# Patient Record
Sex: Female | Born: 2005 | Hispanic: Refuse to answer | Marital: Single | Smoking: Former smoker
Health system: Southern US, Community
[De-identification: ages and names within clinical notes are randomized; demographics above are authoritative.]

## PROBLEM LIST (undated history)

## (undated) ENCOUNTER — Inpatient Hospital Stay: Payer: Self-pay

## (undated) DIAGNOSIS — F32A Depression, unspecified: Secondary | ICD-10-CM

## (undated) DIAGNOSIS — Z789 Other specified health status: Secondary | ICD-10-CM

## (undated) DIAGNOSIS — F909 Attention-deficit hyperactivity disorder, unspecified type: Secondary | ICD-10-CM

## (undated) DIAGNOSIS — F319 Bipolar disorder, unspecified: Secondary | ICD-10-CM

## (undated) DIAGNOSIS — F419 Anxiety disorder, unspecified: Secondary | ICD-10-CM

## (undated) HISTORY — DX: Bipolar disorder, unspecified: F31.9

## (undated) HISTORY — PX: NO PAST SURGERIES: SHX2092

## (undated) HISTORY — DX: Attention-deficit hyperactivity disorder, unspecified type: F90.9

## (undated) HISTORY — DX: Depression, unspecified: F32.A

## (undated) HISTORY — DX: Anxiety disorder, unspecified: F41.9

---
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
## 2024-02-07 NOTE — Progress Notes (Signed)
" ° °  Provider: Rollene JINNY Free, CNM Division: General Obstetrics, Gynecology and Midwifery  Return Prenatal Note   Assessment/Plan   Plan 19 y.o. G2P0101 at [redacted]w[redacted]d presents for follow-up OB visit. Reviewed prenatal record including previous visit note.  Normal intrauterine pregnancy, antepartum (HHS-HCC) Doing well, good FM. Questions answered, routine precautions.   Pelvic pain/low back pain, reviewed comfort measures and she is amenable to pelvic PT referral and this was placed.  History of severe pre-eclampsia BP normal today and no sx of pre-eclampsia.  However, given this is increased over her baseline, will rtc in 1 week (instead of 2) and strict pre-eclampsia precautions reviewed. Advised to bring home BP cuff to clinic, as she reports occasional systolic of 140 at home.  Is taking aspirin per study protocol.  Anemia affecting pregnancy in third trimester (HHS-HCC) Is taking fe regularly, CBC today.   Orders Placed This Encounter  Procedures   CBC    Release to patient:   Immediate [1]   Ambulatory referral to Physical Therapy    Standing Status:   Future    Expected Date:   02/07/2024    Expiration Date:   02/06/2025    Referral Priority:   Routine    Referral Type:   Physical Therapy    Number of Visits Requested:   1   US  OB Follow Up 2nd/3rd Tri Single Gestation    Perform at [place]. S>D, please schedule in next 7-10 days if possible.    Standing Status:   Future    Expected Date:   02/07/2024    Expiration Date:   02/06/2025    Is the patient pregnant?:   Yes    Reason for Exam::   S>D    Performed at:   [facility]   Return in about 1 week (around 02/14/2024) for ROB Midwife visit (has on 2/4, please add this one).   Future Appointments  Date Time Provider Department Center  02/14/2024  2:20 PM Delana Vernell DEL, [place] [facility]  02/21/2024  2:20 PM Mitchell Laneta Europe, CNM [facility]  02/28/2024  2:40 PM Cox, Rollene Schimke, CNM  [facility]   For next visit: Continue with routine prenatal care     Subjective   19 y.o. G2P0101 at [redacted]w[redacted]d presents for this follow-up prenatal visit.   Patient is concerned about pelvic pain.  Patient reports: Movement: Present Loss of Fluid: No Bleeding: No Contractions: Not present  Objective   Flow sheet Vitals: Pulse: 82 BP: 131/68 Fundal Height (cm): 37 cm Fetal Heart Rate: 145 Dilation: Fingertip Effacement (%): 50 Station: -3 Total weight gain: 11.5 kg (25 lb 6.4 oz)  General Appearance  No acute distress, well appearing, and well nourished Pulmonary   Normal work of breathing Neurologic   Alert and oriented to person, place, and time Psychiatric   Mood and affect within normal limits  "

## 2024-02-14 NOTE — Progress Notes (Signed)
" ° °  Provider: VERNELL VEAR MAXIN, CNM Division: General Obstetrics, Gynecology and Midwifery  Return Prenatal Note   Assessment/Plan   Plan 19 y.o. G2P0101 at [redacted]w[redacted]d presents for follow-up OB visit. Reviewed prenatal record including previous visit note.  Size of fetus inconsistent with dates in third trimester (HHS-HCC) Measuring 37 cm today; previously measuring 37 cm at [redacted]w[redacted]d. Has growth US  today at [facility].    Chlamydia infection affecting pregnancy in first trimester (HHS-HCC) Repeat GC/CT done today   Normal intrauterine pregnancy, antepartum (HHS-HCC) Doing well overall; feeling like she is ready for baby.  Discussed that we will review results of growth ultrasound from today.  Reinforced that we recommend induction of labor for medical reasons, but if she desires to be on the elective induction list, she can let us  know. Discussed how eIOL list works and that she will only get a call if there is space on L&D. She will let us  know if she desires to be put on eIOL waitlist.  GBS not done today as she has GBS bacteriuria. Knows she needs PCN in labor.  Discussed warning signs and when to call CNM on call (persistent headache, visual changes, decreased FM, vaginal bleeding, loss of fluid, or regular contractions).      Orders Placed This Encounter  Procedures   Chlamydia/Gonorrhoeae NAA   Return in about 1 week (around 02/21/2024) for Next scheduled follow up.   Future Appointments  Date Time Provider Department Center  02/14/2024  4:00 PM [facility] OB US  RM 4 [id number] - MFM at  02/21/2024  2:20 PM Mitchell Laneta Europe, CNM [facility]  02/28/2024  2:40 PM Cox, Rollene Schimke, CNM [facility]  03/13/2024 11:15 AM Matias Thersia POUR, PT [facility]  03/20/2024 11:15 AM Matias Thersia POUR, PT [facility]  03/27/2024 11:15 AM Matias Thersia POUR, PT [facility]  04/03/2024 11:15 AM Matias Thersia POUR, PT [facility]   04/10/2024 11:15 AM Hundley, Thersia POUR, PT [facility]   For next visit: Continue with routine prenatal care     Subjective   19 y.o. G2P0101 at [redacted]w[redacted]d presents for this follow-up prenatal visit.   Janila is feeling ready to have her baby but knows she has some weeks to go.   Patient reports: Movement: Present Loss of Fluid: No Bleeding: No Contractions: Not present  Objective   Flow sheet Vitals: Pulse: 91 BP: 126/66 Fundal Height (cm): 37 cm Fetal Heart Rate: 145 Total weight gain: 14.1 kg (31 lb 1.6 oz)  General Appearance  No acute distress, well appearing, and well nourished Pulmonary   Normal work of breathing Neurologic   Alert and oriented to person, place, and time Psychiatric   Mood and affect within normal limits  "

## 2024-02-17 ENCOUNTER — Observation Stay
Admission: EM | Admit: 2024-02-17 | Discharge: 2024-02-18 | Disposition: A | Attending: Obstetrics and Gynecology | Admitting: Obstetrics and Gynecology

## 2024-02-17 DIAGNOSIS — H538 Other visual disturbances: Secondary | ICD-10-CM | POA: Insufficient documentation

## 2024-02-17 DIAGNOSIS — R519 Headache, unspecified: Secondary | ICD-10-CM | POA: Insufficient documentation

## 2024-02-17 DIAGNOSIS — Z3A35 35 weeks gestation of pregnancy: Secondary | ICD-10-CM | POA: Diagnosis not present

## 2024-02-17 DIAGNOSIS — O99891 Other specified diseases and conditions complicating pregnancy: Secondary | ICD-10-CM | POA: Insufficient documentation

## 2024-02-17 DIAGNOSIS — R109 Unspecified abdominal pain: Secondary | ICD-10-CM | POA: Diagnosis not present

## 2024-02-17 DIAGNOSIS — O26893 Other specified pregnancy related conditions, third trimester: Principal | ICD-10-CM | POA: Insufficient documentation

## 2024-02-17 DIAGNOSIS — O169 Unspecified maternal hypertension, unspecified trimester: Principal | ICD-10-CM | POA: Diagnosis present

## 2024-02-17 DIAGNOSIS — Z87891 Personal history of nicotine dependence: Secondary | ICD-10-CM | POA: Diagnosis not present

## 2024-02-17 NOTE — Telephone Encounter (Signed)
 CNM returned page , no answer received voicemail message

## 2024-02-17 NOTE — OB Triage Note (Incomplete)
 Pt G3P1 at 35.1 arrives by EMS with c/o elevated BP and headache. Pt is seen at [facility].

## 2024-02-18 ENCOUNTER — Encounter: Payer: Self-pay | Admitting: Obstetrics and Gynecology

## 2024-02-18 LAB — COMPREHENSIVE METABOLIC PANEL WITH GFR
ALT: 22 U/L (ref 0–44)
AST: 19 U/L (ref 15–41)
Albumin: 3.2 g/dL — ABNORMAL LOW (ref 3.5–5.0)
Alkaline Phosphatase: 157 U/L — ABNORMAL HIGH (ref 38–126)
Anion gap: 9 (ref 5–15)
BUN: 8 mg/dL (ref 6–20)
CO2: 22 mmol/L (ref 22–32)
Calcium: 8.9 mg/dL (ref 8.9–10.3)
Chloride: 107 mmol/L (ref 98–111)
Creatinine, Ser: 0.46 mg/dL (ref 0.44–1.00)
GFR, Estimated: >60 mL/min
Glucose, Bld: 78 mg/dL (ref 70–99)
Potassium: 4 mmol/L (ref 3.5–5.1)
Sodium: 138 mmol/L (ref 135–145)
Total Bilirubin: 0.3 mg/dL (ref 0.0–1.2)
Total Protein: 6.2 g/dL — ABNORMAL LOW (ref 6.5–8.1)

## 2024-02-18 LAB — CBC
HCT: 27.2 % — ABNORMAL LOW (ref 36.0–46.0)
Hemoglobin: 9 g/dL — ABNORMAL LOW (ref 12.0–15.0)
MCH: 28.1 pg (ref 26.0–34.0)
MCHC: 33.1 g/dL (ref 30.0–36.0)
MCV: 85 fL (ref 80.0–100.0)
Platelets: 148 10*3/uL — ABNORMAL LOW (ref 150–400)
RBC: 3.2 MIL/uL — ABNORMAL LOW (ref 3.87–5.11)
RDW: 13.1 % (ref 11.5–15.5)
WBC: 13.6 10*3/uL — ABNORMAL HIGH (ref 4.0–10.5)
nRBC: 0 % (ref 0.0–0.2)

## 2024-02-18 LAB — PROTEIN / CREATININE RATIO, URINE
Creatinine, Urine: 105 mg/dL
Protein Creatinine Ratio: 0.1 mg/mg
Total Protein, Urine: 12 mg/dL

## 2024-02-18 NOTE — Discharge Summary (Signed)
 Physician Final Progress Note  Patient ID: Angela Carlson MRN: [id number] DOB/AGE: Jan 13, 2006 19 y.o.  Admit date: 02/17/2024 Admitting provider: Garnette JONETTA Mace, MD Discharge date: 02/18/2024   Admission Diagnoses:  1) Concern for elevated BP at home 2) intrauterine pregnancy at [redacted]w[redacted]d  Discharge Diagnoses:  1) Concern for elevated BP at home. No evidence of gestational hypertension or preeclampsia 2) intrauterine pregnancy at [redacted]w[redacted]d   History of Present Illness: The patient is a 19 y.o. female G3P0111 at [redacted]w[redacted]d who presents for report of elevated BP at home. She reports intermittent headache. She states that she has had some abdominal pain and blurry vision throughout the pregnancy.  She notes +FM, no LOF, no vaginal bleeding, and notes +FM.   Past Medical History:  Diagnosis Date   ADHD    Anxiety    Bipolar 1 disorder (HCC)    Depression    Encounter for vaginal delivery 08/30/2019   Intrauterine pregnancy in teenager 06/19/2019   Medical history non-contributory    Preeclampsia, severe, third trimester 08/29/2019    Past Surgical History:  Procedure Laterality Date   WISDOM TOOTH EXTRACTION  02/09/2022    Medications Ordered Prior to Encounter[1]  Allergies[2]NKDA  Social History   Socioeconomic History   Marital status: Single    Spouse name: Not on file   Number of children: 1   Years of education: Not on file   Highest education level: Not on file  Occupational History   Occupation: student  Tobacco Use   Smoking status: Former    Types: E-cigarettes    Passive exposure: Never   Smokeless tobacco: Never  Vaping Use   Vaping status: Every Day   Substances: Nicotine, Flavoring  Substance and Sexual Activity   Alcohol use: Not Currently    Alcohol/week: 1.0 standard drink of alcohol    Types: 1 Standard drinks or equivalent per week    Comment: last use 10/01/22 2x/mo   Drug use: Yes    Frequency: 7.0 times per week    Types: Marijuana     Comment: LSD, mushrooms, Percocet, xanax, Vyvanse, Adderall   Sexual activity: Yes    Partners: Male    Birth control/protection: Condom  Other Topics Concern   Not on file  Social History Narrative   Not on file   Social Drivers of Health   Tobacco Use: Medium Risk (02/18/2024)   Patient History    Smoking Tobacco Use: Former    Smokeless Tobacco Use: Never    Passive Exposure: Never  Programmer, Applications: Not on file  Food Insecurity: No Food Insecurity (11/03/2023)   Received from [facility]   Epic    Within the past 12 months, you worried that your food would run out before you got the money to buy more.: Never true    Within the past 12 months, the food you bought just didn't last and you didn't have money to get more.: Never true  Transportation Needs: No Transportation Needs (11/03/2023)   Received from [facility] - Transportation    Lack of Transportation (Medical): No    Lack of Transportation (Non-Medical): No  Physical Activity: Not on file  Stress: Not on file  Social Connections: Not on file  Intimate Partner Violence: Not At Risk (10/12/2022)   Humiliation, Afraid, Rape, and Kick questionnaire    Fear of Current or Ex-Partner: No    Emotionally Abused: No    Physically Abused: No    Sexually  Abused: No  Depression (PHQ2-9): Medium Risk (10/12/2022)   Depression (PHQ2-9)    PHQ-2 Score: 9  Alcohol Screen: Not on file  Housing: Not on file  Utilities: Low Risk (11/03/2023)   Received from [facility]   Utilities    Within the past 12 months, have you been unable to get utilities(heat, electricity) when it was really needed?: No  Health Literacy: Not on file    Family History  Problem Relation Age of Onset   Depression Mother    Healthy Father    Healthy Sister    Healthy Sister    Healthy Brother    Healthy Brother    Cancer Maternal Grandmother        lunh   Hypertension Maternal Grandmother    Healthy Maternal  Grandfather    Hypertension Paternal Grandfather    COPD Paternal Grandfather    Diabetes Maternal Aunt    Diabetes Paternal Aunt      Review of Systems  Constitutional: Negative.   HENT: Negative.    Eyes: Negative.   Respiratory: Negative.    Cardiovascular: Negative.   Gastrointestinal:  Positive for abdominal pain (comes and goes).  Genitourinary: Negative.   Musculoskeletal: Negative.   Skin: Negative.   Neurological: Negative.  Negative for headaches.  Psychiatric/Behavioral: Negative.    All other systems reviewed and are negative.    Physical Exam: BP (!) 108/48   Pulse 90   Temp 98.2 F (36.8 C) (Oral)   Resp 16   LMP 04/18/2023 (Approximate)   BP 104-123/48-76  Patient not seen in triage directly  Consults: None  Significant Findings/ Diagnostic Studies:  Lab Results  Component Value Date   WBC 13.6 (H) 02/17/2024   HGB 9.0 (L) 02/17/2024   HCT 27.2 (L) 02/17/2024   PLT 148 (L) 02/17/2024   CREATININE 0.46 02/17/2024   ALT 22 02/17/2024   AST 19 02/17/2024   PROTCRRATIO 0.1 02/17/2024     Procedures:  NST: Baseline FHR: 130 beats/min Variability: moderate Accelerations: present Decelerations: absent Tocometry: quiet  Interpretation:  INDICATIONS: concern for elevated BP RESULTS:  A NST procedure was performed with FHR monitoring and a normal baseline established, appropriate time of 20-40 minutes of evaluation, and accels >2 seen w 15x15 characteristics.  Results show a REACTIVE NST.    Hospital Course: The patient was admitted to Labor and Delivery Triage for observation. She had normal blood pressures. She states that her complaints have been present throughout her pregnancy. The fetal tracing was normal and reactive.  She was discharged with reassurance and precautions for preeclampsia.  Discharge Condition: stable  Disposition: Discharge disposition: 01-Home or Self Care       Diet: Regular diet  Discharge Activity: Activity as  tolerated   Allergies as of 02/18/2024   No Known Allergies      Medication List     ASK your doctor about these medications    acetaminophen  500 MG tablet Commonly known as: TYLENOL  Take 2 tablets (1,000 mg total) by mouth every 6 (six) hours as needed (for pain scale < 4  OR  temperature  >/=  100.5 F).   aspirin 81 MG chewable tablet Chew 81 mg by mouth daily.   Blood Pressure Kit Devi 1 Device by Does not apply route daily. ICD 10: Z34.00   prenatal multivitamin Tabs tablet Take 1 tablet by mouth daily at 12 noon.         Total time spent taking care of this patient:  25 minutes in non face-to-face time.  I did review her chart and her vitals and labs.   Signed: Garnette Mace, MD  02/18/2024, 11:59 AM     [1]  No current facility-administered medications on file prior to encounter.   Current Outpatient Medications on File Prior to Encounter  Medication Sig Dispense Refill   acetaminophen  (TYLENOL ) 500 MG tablet Take 2 tablets (1,000 mg total) by mouth every 6 (six) hours as needed (for pain scale < 4  OR  temperature  >/=  100.5 F). 30 tablet 0   aspirin 81 MG chewable tablet Chew 81 mg by mouth daily.     Blood Pressure Monitoring (BLOOD PRESSURE KIT) DEVI 1 Device by Does not apply route daily. ICD 10: Z34.00 (Patient not taking: Reported on 06/25/2020) 1 each 0   Prenatal Vit-Fe Fumarate-FA (PRENATAL MULTIVITAMIN) TABS tablet Take 1 tablet by mouth daily at 12 noon.    [2] No Known Allergies
